# Patient Record
Sex: Male | Born: 1970 | Race: White | Hispanic: No | Marital: Married | Smoking: Never smoker
Health system: Southern US, Community
[De-identification: ages and names within clinical notes are randomized; demographics above are authoritative.]

## PROBLEM LIST (undated history)

## (undated) DIAGNOSIS — J302 Other seasonal allergic rhinitis: Secondary | ICD-10-CM

## (undated) DIAGNOSIS — M1990 Unspecified osteoarthritis, unspecified site: Secondary | ICD-10-CM

---
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
## 2001-09-24 ENCOUNTER — Emergency Department (HOSPITAL_COMMUNITY): Admission: EM | Admit: 2001-09-24 | Discharge: 2001-09-25 | Payer: Self-pay | Admitting: Emergency Medicine

## 2001-09-25 ENCOUNTER — Encounter: Payer: Self-pay | Admitting: Emergency Medicine

## 2002-04-11 ENCOUNTER — Emergency Department (HOSPITAL_COMMUNITY): Admission: EM | Admit: 2002-04-11 | Discharge: 2002-04-11 | Payer: Self-pay | Admitting: *Deleted

## 2002-04-25 ENCOUNTER — Encounter: Admission: RE | Admit: 2002-04-25 | Discharge: 2002-04-25 | Payer: Self-pay | Admitting: Internal Medicine

## 2002-04-25 ENCOUNTER — Encounter: Payer: Self-pay | Admitting: Internal Medicine

## 2004-06-29 HISTORY — PX: SP ARTHRO ELBOW*R*: HXRAD196

## 2005-07-22 ENCOUNTER — Inpatient Hospital Stay (HOSPITAL_COMMUNITY): Admission: EM | Admit: 2005-07-22 | Discharge: 2005-07-23 | Payer: Self-pay | Admitting: Emergency Medicine

## 2005-07-23 ENCOUNTER — Encounter (INDEPENDENT_AMBULATORY_CARE_PROVIDER_SITE_OTHER): Payer: Self-pay | Admitting: Cardiology

## 2007-03-22 ENCOUNTER — Encounter: Admission: RE | Admit: 2007-03-22 | Discharge: 2007-03-22 | Payer: Self-pay | Admitting: Orthopedic Surgery

## 2009-06-13 ENCOUNTER — Ambulatory Visit (HOSPITAL_BASED_OUTPATIENT_CLINIC_OR_DEPARTMENT_OTHER): Admission: RE | Admit: 2009-06-13 | Discharge: 2009-06-13 | Payer: Self-pay | Admitting: General Surgery

## 2010-07-05 ENCOUNTER — Ambulatory Visit (HOSPITAL_COMMUNITY)
Admission: RE | Admit: 2010-07-05 | Discharge: 2010-07-05 | Payer: Self-pay | Source: Home / Self Care | Attending: Family Medicine | Admitting: Family Medicine

## 2010-09-29 LAB — POCT I-STAT 4, (NA,K, GLUC, HGB,HCT): Glucose, Bld: 92 mg/dL (ref 70–99)

## 2010-11-14 NOTE — Consult Note (Signed)
[name], [name]                 ACCOUNT NO.:  [id number]   MEDICAL RECORD NO.:  [id number]          PATIENT TYPE:  INP   LOCATION:  3041                         FACILITY:  [facility]   PHYSICIAN:  Michael L. Reynolds, M.D.DATE OF BIRTH:  01-02-1971   DATE OF CONSULTATION:  07/23/2005  DATE OF DISCHARGE:                                   CONSULTATION   REQUESTING PHYSICIAN:  Dr. Kirby Funk.   REASON FOR EVALUATION:  Possible TIA.   HISTORY OF PRESENT ILLNESS:  This is the initial outpatient consult visit  for this 40 year old man with little significant past medical history.  The  patient was admitted after an event yesterday.  He states that the event  began with some visual changes.  He noted that he was driving down the road  and he noticed that he could not see very well off to his right.  In  particular, he was driving with a friend sitting in the passenger's seat and  he noticed sometimes that he could not see his friend sitting over the  passenger's seat.  This did seem to come and go somewhat, sometimes being  dark and sometimes being better.  After about 10 minutes or so, this  resolved.  He then arrived at a work site and saw a Surveyor, [employer] with whom he  works very commonly, but states that he could not remember the contractor's  name.  His friend noticed that he seemed a little bit disturbed when he was  trying to talk.  The contractor insisted that the patient see his doctor  immediately and the patient could not recall his doctor's name; however, he  could recall how to get to the office and went to see his primary physician,  Dr. Valentina Lucks.  By this time the patient had developed a headache which he  described as a fairly severe steady pain like a sinus pressure in between  the eyes and deep in the head, which really was not of a throbbing character  and not associated particularly with photophobia or phonophobia.  He says  that he has had headaches like this in the past,  but this was a particularly  severe one and the headache lasted for a few hours and then resolved, and  since then he has felt fine.  He denies ever having any symptoms like this  before.  He does admit on specific questioning that sometimes he will notice  transient blurring of his vision for a couple of minutes, which will come  and go during the day, especially if he is reading; he is not sure if this  monocular or binocular.  He does not really have a significant headache  history.  He denies ever having any significant head injury or trauma and  has never had a seizure to his knowledge.   PAST MEDICAL HISTORY:  1.  As above.  2.  Seasonal allergic rhinitis.  3.  Hemorrhoids.  4.  Prostatitis.  5.  History of tonsillectomy.  6.  Left knee arthroscopy.   FAMILY HISTORY:  His father had some  early coronary disease.  He also has a  history of Crohn's disease and migraine.  His mother also has migraine as  well as hypertension.   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  He is married and has 3 children.  He does not smoke,  occasionally consumes alcohol, denies using illicit drugs.   REVIEW OF SYSTEMS:  Per HPI and per the admission nursing record.   MEDICATIONS:  Prior to admission, he was on no medication.  He has only been  receiving aspirin.   PHYSICAL EXAMINATION:  VITAL SIGNS:  Temperature 97.2 degrees, blood  pressure 128/76, pulse 76, respirations 18, O2 SAT 98% on room air.  GENERAL EXAMINATION:  This is a healthy-appearing man seen in no evident  distress.  HEENT:  Cranium is normocephalic and atraumatic.  Oropharynx benign.  NECK:  Supple without carotid or supraclavicular bruits.  HEART:  Regular rate and rhythm without murmurs.  CHEST:  Clear to auscultation bilaterally.  EXTREMITIES:  No edema.  Pulses 2+.  NEUROLOGIC:  Mental status:  He is awake, alert and fully oriented to time,  place and person.  Recent and  remote memory are intact.  Attention span,  concentration and fund of knowledge are all appropriate.  Speech is fluent  and not dysarthric.  There are no defects to confrontational naming.  Mood  is euthymic and affect appropriate.  Cranial nerves:  Pupils are equal and  reactive.  Extraocular movements are full without nystagmus.  Visual fields  are full to confrontation.  Hearing is intact to conversational speech.  Facial sensation is intact to light touch.  Face, tongue and palate move  normally and symmetrically.  Shoulder shrug strength is normal.  Motor  testing:  Normal bulk and tone.  Normal strength in all tested extremity  muscles.  Sensation intact to light touch in all extremities.  Coordination:  Rapid movements are performed well.  Finger-to-nose and heel-to-shin are  performed well.  Gait:  He arises from a chair easily and his stance is  normal.  He is able to toe and tandem walk without difficulty.  Reflexes are  2+ and symmetric.  Toes are downgoing bilaterally.   LABORATORY REVIEW:  MRI of the brain performed without contrast last night  is personally reviewed with MRA and intracranial circulation, and I would  agree that both of those studies are normal.   He has also had some baseline labs including a CBC, CMET and coagulations  which are unremarkable.   He had a carotid Doppler study done today which demonstrated a 40% to 60%  right ICA stenosis at the low end of the scale.   Two-dimensional echocardiogram is pending at this time.   IMPRESSION:  1.  Transient right-sided visual and speech disturbance; etiology of this is      unclear.  It is possible that he had a transient ischemic attack, but it      is hard to put this together and he does not have any significant risk      factors.  Less likely, this is a possible seizure event.  I am also      suspicious that he may have had a migrainous event. 2.  Mild right internal carotid artery stenosis by  Doppler.   RECOMMENDATIONS:  He will take an aspirin daily and reassess his risk  factors including blood pressure, blood sugar, cholesterol, etc.  We will  also  check an EEG before he leaves just to make sure there is no evidence of  seizure.  I think he is stable for discharge once his tests are done and he  can follow up with his primary care doctor and with [place] in a few weeks.  If  symptoms recur, we may consider a migraine prophylactic.   Thank you for the consultation.      Michael L. Thad Ranger, M.D.  Electronically Signed    MLR/MEDQ  D:  07/23/2005  T:  07/24/2005  Job:  [id number]

## 2010-11-14 NOTE — Procedures (Signed)
EEG NUMBER:  [id number].   REQUESTING PHYSICIAN:  Marolyn Hammock. Thad Ranger, M.D.   CLINICAL HISTORY:  This is a 40 year old man being evaluated for transient  speech and visual disturbance, possible seizure.  Patient is described as  awake.  This is a routine EEG done with photic stimulation and  hyperventilation.   DESCRIPTION:  The dominant rhythm of this tracing is a moderate amplitude of  alpha rhythm of 10 Hz which predominates posteriorly.  Appears to not have  normal asymmetry and attenuates with eye opening and closing.  Low amplitude  fast activity is seen frontally and centrally and appears without abnormal  asymmetry.  No focal slowing is noted. No epileptiform discharges are seen.  Brief bouts of drowsiness are seen as evidenced by occasional fragmentation  in the background and occasional appearance of a generalized slightly higher  amplitude [place] rhythm of 7 to 8 Hz without focal abnormalities or  epileptiform discharges.  No sleep is recorded during the study.  Photic  stimulation produced symmetric driving responses.  Hyperventilation produced  mild slowing and build-up in the background rhythms.  Single channel devoted  to EKG revealed sinus rhythm throughout with a rate of approximately 72  beats per minute.   CONCLUSION:  Normal study in the awake state.      Michael L. Thad Ranger, M.D.  Electronically Signed     ZOX:WRUE  D:  07/23/2005 17:30:38  T:  07/24/2005 09:03:35  Job #:  [id number]   cc:   Casimiro Needle L. Thad Ranger, M.D.  Fax: [phone]

## 2010-11-14 NOTE — Discharge Summary (Signed)
NAMERAYEN, [name] NO.:  Buckley   MEDICAL RECORD NO.:  [id number]          PATIENT TYPE:  INP   LOCATION:  3041                         [facility]AC[name]   PHYSICIAN:  [name], M.D.  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  He could not see the friend out of his peripheral vision.  He later noticed  that when talking, he could not seem to find appropriate words.  He knew  what he wanted to say, but the words would not come out and make sense.  This was followed by onset of moderate-to-severe headache which was behind  both eyes.  This eventually resolved later in the evening.  There was no  nausea, vomiting, photophobia or phonophobia.  When he drove to his job, he  met a Surveyor, [employer] who he knows very well, but could not remember his name.  He could not remember my name.  The contractor insisted he come to my office  to be checked.   SIGNIFICANT FINDINGS:  VITAL SIGNS:  Blood pressure 128/90, heart rate 84,  temperature 98.4.  Weight is 206.  LUNGS:  Clear.  HEART:  Regular rate and rhythm without murmur, gallop or rub.  ABDOMEN:  Soft and nontender.  No mass or hepatosplenomegaly.  EXTREMITIES:  No edema.  NEUROLOGICAL:  Alert and oriented x3.  Cranial nerves II-XII are intact.  Strength is 5/5 in all extremities.  Reflexes are 2/4 throughout and toes  downgoing.  Gait normal.  Heel-to-toe well-done.  Visual fields were intact.   ADMISSION LABORATORY DATA:  WBC 10.2, hemoglobin 16.8, platelet count  233,000.  PT 12.6, PTT 28.  Sodium 138, potassium 4.2, chloride 106,  bicarbonate 26, glucose 81, BUN 20, creatinine 1.1, calcium 9.1, total  protein 7.2, albumin 4.1, SGOT 33, SGPT 45, alkaline  phosphatase 53,  bilirubin 0.8.   RADIOLOGIC FINDINGS:  Chest x-ray:  No active disease.   ACCESSORY CLINICAL DATA:  Normal sinus rhythm, normal EKG.   HOSPITAL COURSE:  The patient was admitted with a differential of TIA,  migraine headache or seizure.  He had a TIA workup which included:  #1 -  MRI/MRA of the intracranial vessels which were normal.  #2 - A carotid  ultrasound which showed a 40% to 60% ICA stenosis on the right and no  hemodynamically significant stenosis on the left.  Vertebral flow was  antegrade.  #3 - Two-dimensional echocardiogram which was normal.  #4 - An  EEG which is pending at the time of this dictation.  The patient was placed  on 1 aspirin a day.  He was seen by the Neurology Service, Dr. Jacki Buckley.  Dr. Thad Buckley' impression was transient speech and right-sided  visual disturbance, unclear etiology, ? migraine effect versus TIA or  seizure.  The patient did have a lipid profile, TSH, homocysteine,  hemoglobin A1c which was pending at the time of this dictation.  His fasting  glucose and ESR were normal.   DISCHARGE DIAGNOSES:  1.  Transient visual disturbance.  2.  Aphasia.  3.  Confusion.   PROCEDURES:  1.  MRI/MRA of the brain.  2.  Two-dimensional echocardiogram.  3.  Carotid ultrasound.  4.  EEG.   DISCHARGE MEDICATIONS:  Aspirin 325 mg a day.   DIET:  Regular.   ACTIVITY:  Activity as tolerated.   FOLLOWUP:  Follow up in 1-2 weeks with Dr. Valentina Buckley, also following up with  Dr. Kelli Buckley.           ______________________________  [name], M.D.     JJG/MEDQ  D:  07/24/2005  T:  07/24/2005  Job:  [id number]   cc:   Casimiro Needle L. [name], M.D.  Fax: [phone]

## 2011-12-19 ENCOUNTER — Encounter (HOSPITAL_COMMUNITY): Payer: Self-pay | Admitting: Emergency Medicine

## 2011-12-19 DIAGNOSIS — IMO0002 Reserved for concepts with insufficient information to code with codable children: Secondary | ICD-10-CM | POA: Insufficient documentation

## 2011-12-19 NOTE — ED Notes (Signed)
PY. REPORTS ABSCESS AT LEFT ELBOW WITH NO DRAINAGE ONSET THIS EVENING .

## 2011-12-20 ENCOUNTER — Emergency Department (HOSPITAL_COMMUNITY)
Admission: EM | Admit: 2011-12-20 | Discharge: 2011-12-20 | Disposition: A | Discharge status: Home / Self Care | Payer: 59 | Attending: Emergency Medicine | Admitting: Emergency Medicine

## 2011-12-20 DIAGNOSIS — L0390 Cellulitis, unspecified: Secondary | ICD-10-CM

## 2011-12-20 MED ORDER — CLINDAMYCIN PHOSPHATE 900 MG/50ML IV SOLN
900.0000 mg | Freq: Once | INTRAVENOUS | Status: AC
  Administered 2011-12-20: 900 mg via INTRAVENOUS
  Filled 2011-12-20: qty 50

## 2011-12-20 MED ORDER — CLINDAMYCIN HCL 300 MG PO CAPS: 300.0000 mg | ORAL_CAPSULE | Freq: Four times a day (QID) | ORAL | Status: AC

## 2011-12-20 NOTE — Discharge Instructions (Signed)
You were seen and evaluated for swelling and pain to her left elbow area. At this time your providers were concerned for possible skin infection. Given given prescriptions for antibiotics to use for your infection. Please followup in the next 24-48 hours with your primary care provider or return to emergency room for recheck of your symptoms and infection. If your symptoms worsen, you develop fever, chills, sweats please return sooner.    Cellulitis Cellulitis is an infection of the skin and the tissue beneath it. The area is typically red and tender. It is caused by germs (bacteria) (usually staph or strep) that enter the body through cuts or sores. Cellulitis most commonly occurs in the arms or lower legs.  HOME CARE INSTRUCTIONS   If you are given a prescription for medications which kill germs (antibiotics), take as directed until finished.   If the infection is on the arm or leg, keep the limb elevated as able.   Use a warm cloth several times per day to relieve pain and encourage healing.   See your caregiver for recheck of the infected site as directed if problems arise.   Only take over-the-counter or prescription medicines for pain, discomfort, or fever as directed by your caregiver.  SEEK MEDICAL CARE IF:   The area of redness (inflammation) is spreading, there are red streaks coming from the infected site, or if a part of the infection begins to turn dark in color.   The joint or bone underneath the infected skin becomes painful after the skin has healed.   The infection returns in the same or another area after it seems to have gone away.   A boil or bump swells up. This may be an abscess.   New, unexplained problems such as pain or fever develop.  SEEK IMMEDIATE MEDICAL CARE IF:   You have a fever.   You or your child feels drowsy or lethargic.   There is vomiting, diarrhea, or lasting discomfort or feeling ill (malaise) with muscle aches and pains.  MAKE SURE YOU:    Understand these instructions.   Will watch your condition.   Will get help right away if you are not doing well or get worse.  Document Released: 03/25/2005 Document Revised: 06/04/2011 Document Reviewed: 02/01/2008 [facility] Patient Information 2012 [place], [place].

## 2011-12-20 NOTE — ED Provider Notes (Signed)
Medical screening examination/treatment/procedure(s) were performed by non-physician practitioner and as supervising physician I was immediately available for consultation/collaboration.  Gerhard Munch, MD 12/20/11 [phone]

## 2011-12-20 NOTE — ED Provider Notes (Signed)
History     CSN: [id number]  Arrival date & time 12/19/11  2229   First MD Initiated Contact with Patient 12/20/11 0037      Chief Complaint  Patient presents with  . Abscess   HPI  History provided by the patient. Patient is a 41 year old male with no significant past medical history presents with complaints of pain, redness and swelling to his left elbow and increased this evening. 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 Today patient began to have redness and swelling he states he was unsure if she was bitten by something of concern of having infection over the elbow area. Patient reports having swelling and injury to his right elbow 4-5 years ago that had a severe infection that required surgery by orthopedic specialist and drainage. Patient states he was seen by Dr. Madelon Lips at that time. Today patient is worried that something similar may be happening to the left elbow and skin area. Patient has some pain with full extension full flexion of the elbow otherwise has full range of motion. He denies any numbness or weakness to the hand. He denies any erythematous streaks, fever, chills or sweats.    History reviewed. No pertinent past medical history.  History reviewed. No pertinent past surgical history.  No family history on file.  History  Substance Use Topics  . Smoking status: Never Smoker   . Smokeless tobacco: Not on file  . Alcohol Use: Yes      Review of Systems  Constitutional: Negative for fever, chills and diaphoresis.  Gastrointestinal: Negative for nausea and vomiting.  Musculoskeletal: Positive for joint swelling.  Skin: Positive for rash.  Neurological: Negative for weakness and numbness.    Allergies  Review of patient's allergies indicates no known allergies.  Home Medications  No current outpatient prescriptions on file.  BP 148/72  Pulse 87  Temp 97.9 F (36.6 C) (Oral)  Resp 14  SpO2  98%  Physical Exam  Nursing note and vitals reviewed. Constitutional: He is oriented to person, place, and time. He appears well-developed and well-nourished.  HENT:  Head: Normocephalic.  Cardiovascular: Normal rate and regular rhythm.   Pulmonary/Chest: Effort normal and breath sounds normal.  Musculoskeletal:       Erythema and mild swelling over the posterior left elbow with mild tenderness to palpation. No significant induration or fluctuance noted. There is a single small pustule possible ingrown hair to lateral aspect within the swelling. Normal distal radial pulses, grip strength and sensation in fingers.  Neurological: He is alert and oriented to person, place, and time.  Psychiatric: He has a normal mood and affect.    ED Course  Procedures       1. Cellulitis       MDM  Patient seen and evaluated. Patient no acute distress.  Skin is erythematous and warm to the touch. Symptoms are concerning for possible cellulitis. Skin marker used to outline erythematous area. Patient given IV dose of clindamycin. Patient also prescription for clindamycin and continue outpatient. Patient instructed for recheck of symptoms in the next 48 hours.     Angus Seller, [place] 12/20/11 [phone]

## 2012-09-28 ENCOUNTER — Ambulatory Visit: Payer: 59

## 2012-09-28 ENCOUNTER — Ambulatory Visit (INDEPENDENT_AMBULATORY_CARE_PROVIDER_SITE_OTHER): Payer: 59 | Admitting: Family Medicine

## 2012-09-28 VITALS — BP 132/82 | HR 79 | Temp 98.1°F | Resp 16 | Ht 69.5 in | Wt 201.0 lb

## 2012-09-28 DIAGNOSIS — R51 Headache: Secondary | ICD-10-CM

## 2012-09-28 DIAGNOSIS — J329 Chronic sinusitis, unspecified: Secondary | ICD-10-CM

## 2012-09-28 LAB — POCT CBC
Lymph, poc: 2.3 (ref 0.6–3.4)
MCH, POC: 29.9 pg (ref 27–31.2)
MCHC: 32.8 g/dL (ref 31.8–35.4)
MCV: 91 fL (ref 80–97)
MID (cbc): 0.4 (ref 0–0.9)
MPV: 9.5 fL (ref 0–99.8)
POC LYMPH PERCENT: 35.1 %L (ref 10–50)
POC MID %: 6.9 %M (ref 0–12)
Platelet Count, POC: 233 10*3/uL (ref 142–424)
RBC: 5.15 M/uL (ref 4.69–6.13)
RDW, POC: 13.5 %
WBC: 6.5 10*3/uL (ref 4.6–10.2)

## 2012-09-28 MED ORDER — AMOXICILLIN 875 MG PO TABS: 875.0000 mg | ORAL_TABLET | Freq: Two times a day (BID) | ORAL | Status: DC

## 2012-09-28 MED ORDER — FLUTICASONE PROPIONATE 50 MCG/ACT NA SUSP: 2.0000 | Freq: Every day | NASAL | Status: DC

## 2012-09-28 MED ORDER — PREDNISONE 20 MG PO TABS: ORAL_TABLET | ORAL | Status: DC

## 2012-09-28 NOTE — Patient Instructions (Addendum)
Use the nose spray 2 sprays each nostril twice daily for 3 days, then drop back to once daily  Take the prednisone 3 daily for 2 days, then daily for 2 days, then one daily for 2 days as directed  Take the amoxicillin one twice daily for 3 weeks  If you're not improving we will need to get the ear nose and throat doctor for recheck in

## 2012-09-28 NOTE — Progress Notes (Signed)
Subjective: 42 year old man with history of chronic sinusitis off and on. 2 years ago he had to go to an ENT, and CT scan revealed chronic sinusitis with a right maxillary sinus. He has been having problems at: January February and has been feeling lousy since then. He just doesn't have much energy. He has recurrent facial headaches, not on a daily basis. He has nasal congestion. He does have a history of deviated septum also. He does not smoke. He works out to [employer] out at [employer].   Objective: TMs normal. Throat clear. Minimal sinus tenderness. Eyes PERRLA. Chest clear. Heart regular.  Assessment: History chronic sinusitis with headaches and facial pain for past couple of months  Plan: Sinus series, CBC, sedimentation rate  [facility] reading (PRIMARY) by  Dr. Alwyn Ren Right maxillary haziness and ? Cyst.  This was seen on a previous CT 2 years ago apparently.  Results for orders placed in visit on 09/28/12  POCT CBC      Result Value Range   WBC 6.5  4.6 - 10.2 K/uL   Lymph, poc 2.3  0.6 - 3.4   POC LYMPH PERCENT 35.1  10 - 50 %L   MID (cbc) 0.4  0 - 0.9   POC MID % 6.9  0 - 12 %M   POC Granulocyte 3.8  2 - 6.9   Granulocyte percent 58.0  37 - 80 %G   RBC 5.15  4.69 - 6.13 M/uL   Hemoglobin 15.4  14.1 - 18.1 g/dL   HCT, POC 16.1  09.6 - 53.7 %   MCV 91.0  80 - 97 fL   MCH, POC 29.9  27 - 31.2 pg   MCHC 32.8  31.8 - 35.4 g/dL   RDW, POC 04.5     Platelet Count, POC 233  142 - 424 K/uL   MPV 9.5  0 - 99.8 fL   Will RX for chronic sinusitis .

## 2012-10-26 ENCOUNTER — Emergency Department (HOSPITAL_COMMUNITY)
Admission: EM | Admit: 2012-10-26 | Discharge: 2012-10-26 | Disposition: A | Discharge status: Home / Self Care | Payer: 59 | Source: Home / Self Care | Attending: Emergency Medicine | Admitting: Emergency Medicine

## 2012-10-26 ENCOUNTER — Encounter (HOSPITAL_COMMUNITY): Payer: Self-pay | Admitting: *Deleted

## 2012-10-26 DIAGNOSIS — L270 Generalized skin eruption due to drugs and medicaments taken internally: Secondary | ICD-10-CM

## 2012-10-26 HISTORY — DX: Other seasonal allergic rhinitis: J30.2

## 2012-10-26 NOTE — ED Provider Notes (Signed)
Medical screening examination/treatment/ procedure(s) were performed by non-physician practitioner and as supervising physician I was immediately available for consultations/colaborattion.   [employer] [place],M.D.  Duwayne Heck [place], MD 10/26/12 2109

## 2012-10-26 NOTE — ED Notes (Signed)
C/o rash on his arms, chest and shoulders onset 1 week ago.  Face neck and arms are red like sunburn.  States he was on Amoxil 875 mg. For sinus infection but stopped it yesterday.  He stopped the Prednisone and Fluticasone 1 week ago.  C/o painful lymph nodes in his neck, ears stopped up and hurting and pressure around his eyes. C/o low grade fever.  Has not checked it.  C/o feeling like his throat is closing up onset last week.  States it comes and goes- mostly at night.  C/o aching all over.

## 2012-10-26 NOTE — ED Provider Notes (Signed)
History     CSN: [id number]  Arrival date & time 10/26/12  1437   First MD Initiated Contact with Patient 10/26/12 1650      Chief Complaint  Patient presents with  . Rash    (Consider location/radiation/quality/duration/timing/severity/associated sxs/prior treatment) HPI Comments: Pt reports taking amoxicillin prescribed on by outside urgent care for sinus infection until yesterday. Thinks is causing rash on body and "redness" of face x 1 week.   Patient is a 42 y.o. male presenting with rash. The history is provided by the patient.  Rash Location:  Torso and shoulder/arm Shoulder/arm rash location:  L upper arm and R upper arm Torso rash location:  Upper back, L chest and R chest Quality: redness   Quality: not itchy and not painful   Severity:  Moderate Onset quality:  Sudden Duration:  1 week Timing:  Constant Progression:  Spreading Chronicity:  New Context: medications   Relieved by:  Nothing Worsened by:  Nothing tried Ineffective treatments:  None tried Associated symptoms: no fever and no myalgias     Past Medical History  Diagnosis Date  . Seasonal allergies     Past Surgical History  Procedure Laterality Date  . Sp arthro elbow*r* Right 2006  . Knee arthroscopy Left 1989    Family History  Problem Relation Age of Onset  . Diabetes Mother   . Cancer Father     Prostate    History  Substance Use Topics  . Smoking status: Never Smoker   . Smokeless tobacco: Not on file  . Alcohol Use: Yes     Comment: occasional      Review of Systems  Constitutional: Negative for fever and chills.  Musculoskeletal: Negative for myalgias.  Skin: Positive for rash.    Allergies  Review of patient's allergies indicates no known allergies.  Home Medications   Current Outpatient Rx  Name  Route  Sig  Dispense  Refill  . amoxicillin (AMOXIL) 875 MG tablet   Oral   Take 1 tablet (875 mg total) by mouth 2 (two) times daily.   40 tablet   0   .  fluticasone (FLONASE) 50 MCG/ACT nasal spray   Nasal   Place 2 sprays into the nose daily.   16 g   1   . predniSONE (DELTASONE) 20 MG tablet      Take 3 daily for 2 days, then 2 daily for 2 days, then 1 daily for 2 days   12 tablet   0     BP 147/100  Pulse 69  Temp(Src) 98.3 F (36.8 C) (Oral)  Resp 16  SpO2 98%  Physical Exam  Constitutional: He appears well-developed and well-nourished. No distress.  HENT:  Nose: Right sinus exhibits maxillary sinus tenderness and frontal sinus tenderness. Left sinus exhibits maxillary sinus tenderness and frontal sinus tenderness.  Mouth/Throat: Oropharynx is clear and moist and mucous membranes are normal.  Lymphadenopathy:       Head (right side): No submental, no submandibular and no tonsillar adenopathy present.       Head (left side): No submental, no submandibular and no tonsillar adenopathy present.  Skin: Skin is warm and dry. Rash noted. Rash is macular.  Macular/patchy pink rash on upper arms, chest and upper back, blanchable.      ED Course  Procedures (including critical care time)  Labs Reviewed - No data to display No results found.   1. Drug eruption       MDM  Rash  most likely from drug reaction. Mild reaction, no treatment needed. Pt has already stopped amoxicillin, will add to his drug allergies.  Pt c/o sinus congestion at end of visit. Recommended he talk with his pcp about referral to ENT, and recommended he restart using flonase spray (which he had stopped) and try nasal saline spray.         Cathlyn Parsons, NP 10/26/12 [phone]

## 2014-07-03 ENCOUNTER — Ambulatory Visit
Admission: RE | Admit: 2014-07-03 | Discharge: 2014-07-03 | Disposition: A | Discharge status: Home / Self Care | Payer: BLUE CROSS/BLUE SHIELD | Source: Ambulatory Visit | Attending: Internal Medicine | Admitting: Internal Medicine

## 2014-07-03 ENCOUNTER — Other Ambulatory Visit: Payer: Self-pay | Admitting: Internal Medicine

## 2014-07-03 DIAGNOSIS — M542 Cervicalgia: Secondary | ICD-10-CM

## 2016-05-29 ENCOUNTER — Other Ambulatory Visit: Payer: Self-pay | Admitting: Gastroenterology

## 2016-06-26 ENCOUNTER — Encounter (HOSPITAL_COMMUNITY): Payer: Self-pay

## 2016-07-02 ENCOUNTER — Encounter (HOSPITAL_COMMUNITY): Payer: Self-pay

## 2016-07-06 ENCOUNTER — Ambulatory Visit (HOSPITAL_COMMUNITY): Payer: 59 | Admitting: Certified Registered Nurse Anesthetist

## 2016-07-06 ENCOUNTER — Ambulatory Visit (HOSPITAL_COMMUNITY)
Admission: RE | Admit: 2016-07-06 | Discharge: 2016-07-06 | Disposition: A | Discharge status: Home / Self Care | Payer: 59 | Source: Ambulatory Visit | Attending: Gastroenterology | Admitting: Gastroenterology

## 2016-07-06 ENCOUNTER — Encounter (HOSPITAL_COMMUNITY)
Admission: RE | Disposition: A | Discharge status: Home / Self Care | Payer: Self-pay | Source: Ambulatory Visit | Attending: Gastroenterology

## 2016-07-06 ENCOUNTER — Encounter (HOSPITAL_COMMUNITY): Payer: Self-pay

## 2016-07-06 DIAGNOSIS — Z86010 Personal history of colonic polyps: Secondary | ICD-10-CM | POA: Insufficient documentation

## 2016-07-06 DIAGNOSIS — Z1211 Encounter for screening for malignant neoplasm of colon: Secondary | ICD-10-CM | POA: Insufficient documentation

## 2016-07-06 HISTORY — DX: Unspecified osteoarthritis, unspecified site: M19.90

## 2016-07-06 HISTORY — PX: COLONOSCOPY WITH PROPOFOL: SHX5780

## 2016-07-06 SURGERY — COLONOSCOPY WITH PROPOFOL
Anesthesia: Monitor Anesthesia Care

## 2016-07-06 MED ORDER — PROPOFOL 10 MG/ML IV BOLUS
INTRAVENOUS | Status: AC
  Filled 2016-07-06: qty 40

## 2016-07-06 MED ORDER — PROPOFOL 10 MG/ML IV BOLUS
INTRAVENOUS | Status: DC | PRN
  Administered 2016-07-06: 20 mg via INTRAVENOUS

## 2016-07-06 MED ORDER — PROPOFOL 500 MG/50ML IV EMUL
INTRAVENOUS | Status: DC | PRN
  Administered 2016-07-06: 200 ug/kg/min via INTRAVENOUS

## 2016-07-06 MED ORDER — ONDANSETRON HCL 4 MG/2ML IJ SOLN
INTRAMUSCULAR | Status: AC
  Filled 2016-07-06: qty 2

## 2016-07-06 MED ORDER — LACTATED RINGERS IV SOLN
INTRAVENOUS | Status: DC
  Administered 2016-07-06: 08:00:00 via INTRAVENOUS

## 2016-07-06 MED ORDER — LIDOCAINE 2% (20 MG/ML) 5 ML SYRINGE
INTRAMUSCULAR | Status: DC | PRN
  Administered 2016-07-06: 100 mg via INTRAVENOUS

## 2016-07-06 MED ORDER — LIDOCAINE 2% (20 MG/ML) 5 ML SYRINGE
INTRAMUSCULAR | Status: AC
  Filled 2016-07-06: qty 5

## 2016-07-06 MED ORDER — SODIUM CHLORIDE 0.9 % IV SOLN: INTRAVENOUS | Status: DC

## 2016-07-06 MED ORDER — ONDANSETRON HCL 4 MG/2ML IJ SOLN
INTRAMUSCULAR | Status: DC | PRN
  Administered 2016-07-06: 4 mg via INTRAVENOUS

## 2016-07-06 SURGICAL SUPPLY — 22 items

## 2016-07-06 NOTE — Op Note (Signed)
[facility] Patient Name: [name] Procedure Date: 07/06/2016 MRN: [id number] Attending MD: Charolett Bumpers , MD Date of Birth: 11/27/1970 CSN: [id number] Age: 46 Admit Type: Outpatient Procedure:                Colonoscopy Indications:              High risk colon cancer surveillance: Personal                            history of non-advanced adenoma: 03/24/2011                            colonoscopy was performed with removal of a 3 mm                            tubular adenomatous descending colon polyp Providers:                Charolett Bumpers, MD, Roselie Awkward, RN, Lorenda Ishihara, Technician, Maricela Curet, CRNA Referring MD:              Medicines:                Propofol per Anesthesia Complications:            No immediate complications. Estimated Blood Loss:     Estimated blood loss: none. Procedure:                Pre-Anesthesia Assessment:                           - Prior to the procedure, a History and Physical                            was performed, and patient medications and                            allergies were reviewed. The patient's tolerance of                            previous anesthesia was also reviewed. The risks                            and benefits of the procedure and the sedation                            options and risks were discussed with the patient.                            All questions were answered, and informed consent                            was obtained. Prior Anticoagulants: The patient has  taken no previous anticoagulant or antiplatelet                            agents. ASA Grade Assessment: II - A patient with                            mild systemic disease. After reviewing the risks                            and benefits, the patient was deemed in                            satisfactory condition to undergo the procedure.                           After  obtaining informed consent, the colonoscope                            was passed under direct vision. Throughout the                            procedure, the patient's blood pressure, pulse, and                            oxygen saturations were monitored continuously. The                            Colonoscope was introduced through the anus and                            advanced to the the cecum, identified by                            appendiceal orifice and ileocecal valve. The                            colonoscopy was performed without difficulty. The                            patient tolerated the procedure well. The quality                            of the bowel preparation was good. The terminal                            ileum, the ileocecal valve, the appendiceal orifice                            and the rectum were photographed. Scope In: 8:47:38 AM Scope Out: 8:59:03 AM Scope Withdrawal Time: 0 hours 8 minutes 8 seconds  Total Procedure Duration: 0 hours 11 minutes 25 seconds  Findings:      The perianal and digital rectal examinations were normal.      The entire examined colon appeared normal. Impression:               -  The entire examined colon is normal.                           - No specimens collected. Moderate Sedation:      N/A- Per Anesthesia Care Recommendation:           - Patient has a contact number available for                            emergencies. The signs and symptoms of potential                            delayed complications were discussed with the                            patient. Return to normal activities tomorrow.                            Written discharge instructions were provided to the                            patient.                           - Repeat colonoscopy in 10 years for surveillance.                           - Resume previous diet.                           - Continue present medications. Procedure Code(s):         --- Professional ---                           [id number], Colorectal cancer screening; colonoscopy on                            individual at high risk Diagnosis Code(s):        --- Professional ---                           Z86.010, Personal history of colonic polyps CPT copyright 2016 [employer]. All rights reserved. The codes documented in this report are preliminary and upon coder review may  be revised to meet current compliance requirements. Danise EdgeMartin Dmiya Malphrus, MD Charolett BumpersMartin K Shaquia Berkley, MD 07/06/2016 9:05:45 AM This report has been signed electronically. Number of Addenda: 0

## 2016-07-06 NOTE — Discharge Instructions (Signed)

## 2016-07-06 NOTE — Transfer of Care (Signed)
Immediate Anesthesia Transfer of Care Note  Patient: [name]  Procedure(s) Performed: Procedure(s): COLONOSCOPY WITH PROPOFOL (N/A)  Patient Location: PACU  Anesthesia Type:MAC  Level of Consciousness:  sedated, patient cooperative and responds to stimulation  Airway & Oxygen Therapy:Patient Spontanous Breathing and Patient connected to face mask oxgen  Post-op Assessment:  Report given to PACU RN and Post -op Vital signs reviewed and stable  Post vital signs:  Reviewed and stable  Last Vitals:  Vitals:   07/06/16 0806 07/06/16 0816  BP: (!) 156/100 (!) 142/94  Pulse: 90   Resp: 16   Temp: 76.1 C     Complications: No apparent anesthesia complications

## 2016-07-06 NOTE — H&P (Signed)
Procedure: Surveillance colonoscopy. 03/24/2011 colonoscopy was performed with removal of a 3 mm ascending colon tubular adenomatous polyp  History: The patient is a 46 year old male born 10/29/1970. He is scheduled to undergo a surveillance colonoscopy today.  Past medical history: Seasonal allergic rhinitis. Anal fissure. Tonsillectomy. Left knee arthroscopy. Left elbow surgery. Anal fissure surgery. Depression.  Exam: The patient is alert and lying comfortably on the endoscopy stretcher. Abdomen is soft and nontender to palpation. Lungs are clear to auscultation. Cardiac exam reveals a regular rhythm.  Plan: Proceed with surveillance colonoscopy

## 2016-07-06 NOTE — Anesthesia Postprocedure Evaluation (Addendum)
Anesthesia Post Note  Patient: [name]  Procedure(s) Performed: Procedure(s) (LRB): COLONOSCOPY WITH PROPOFOL (N/A)  Patient location during evaluation: PACU Anesthesia Type: MAC Level of consciousness: awake and alert Pain management: pain level controlled Vital Signs Assessment: post-procedure vital signs reviewed and stable Respiratory status: spontaneous breathing Cardiovascular status: stable Anesthetic complications: no       Last Vitals:  Vitals:   07/06/16 0920 07/06/16 0925  BP: (!) 140/92   Pulse: 73 67  Resp: 13 16  Temp:      Last Pain:  Vitals:   07/06/16 0918  TempSrc: Oral                 Nolon Nations

## 2016-07-06 NOTE — Anesthesia Preprocedure Evaluation (Signed)
Anesthesia Evaluation  Patient identified by MRN, date of birth, ID band Patient awake    Reviewed: Allergy & Precautions, NPO status , Patient's Chart, lab work & pertinent test results  Airway Mallampati: II  TM Distance: >3 FB Neck ROM: Full    Dental no notable dental hx.    Pulmonary neg pulmonary ROS,    Pulmonary exam normal breath sounds clear to auscultation       Cardiovascular negative cardio ROS Normal cardiovascular exam Rhythm:Regular Rate:Normal     Neuro/Psych negative neurological ROS  negative psych ROS   GI/Hepatic negative GI ROS, Neg liver ROS,   Endo/Other  negative endocrine ROS  Renal/GU negative Renal ROS     Musculoskeletal  (+) Arthritis ,   Abdominal   Peds  Hematology negative hematology ROS (+)   Anesthesia Other Findings   Reproductive/Obstetrics negative OB ROS                             Anesthesia Physical Anesthesia Plan  ASA: II  Anesthesia Plan: MAC   Post-op Pain Management:    Induction: Intravenous  Airway Management Planned:   Additional Equipment:   Intra-op Plan:   Post-operative Plan:   Informed Consent: I have reviewed the patients History and Physical, chart, labs and discussed the procedure including the risks, benefits and alternatives for the proposed anesthesia with the patient or authorized representative who has indicated his/her understanding and acceptance.   Dental advisory given  Plan Discussed with: CRNA  Anesthesia Plan Comments:         Anesthesia Quick Evaluation

## 2016-07-06 NOTE — Addendum Note (Signed)
Addendum  created 07/06/16 1122 by Lewie LoronJohn Jafar Poffenberger, MD   Sign clinical note

## 2016-07-07 ENCOUNTER — Encounter (HOSPITAL_COMMUNITY): Payer: Self-pay | Admitting: Gastroenterology

## 2018-10-22 ENCOUNTER — Ambulatory Visit (HOSPITAL_COMMUNITY)
Admission: EM | Admit: 2018-10-22 | Discharge: 2018-10-22 | Disposition: A | Discharge status: Home / Self Care | Payer: Managed Care, Other (non HMO) | Attending: Family Medicine | Admitting: Family Medicine

## 2018-10-22 ENCOUNTER — Encounter (HOSPITAL_COMMUNITY): Payer: Self-pay | Admitting: Emergency Medicine

## 2018-10-22 ENCOUNTER — Ambulatory Visit (INDEPENDENT_AMBULATORY_CARE_PROVIDER_SITE_OTHER): Payer: Managed Care, Other (non HMO)

## 2018-10-22 DIAGNOSIS — S39012A Strain of muscle, fascia and tendon of lower back, initial encounter: Secondary | ICD-10-CM

## 2018-10-22 DIAGNOSIS — S161XXA Strain of muscle, fascia and tendon at neck level, initial encounter: Secondary | ICD-10-CM

## 2018-10-22 MED ORDER — IBUPROFEN 800 MG PO TABS
ORAL_TABLET | ORAL | Status: AC
  Filled 2018-10-22: qty 1

## 2018-10-22 MED ORDER — CYCLOBENZAPRINE HCL 10 MG PO TABS: ORAL_TABLET | ORAL | 0 refills | Status: AC

## 2018-10-22 MED ORDER — DICLOFENAC SODIUM 75 MG PO TBEC: 75.0000 mg | DELAYED_RELEASE_TABLET | Freq: Two times a day (BID) | ORAL | 0 refills | Status: AC

## 2018-10-22 MED ORDER — IBUPROFEN 800 MG PO TABS
800.0000 mg | ORAL_TABLET | Freq: Once | ORAL | Status: AC
  Administered 2018-10-22: 18:00:00 800 mg via ORAL
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
## 2018-10-27 ENCOUNTER — Telehealth (HOSPITAL_COMMUNITY): Payer: Self-pay | Admitting: Emergency Medicine

## 2018-10-27 NOTE — Telephone Encounter (Signed)
Patient called and given xray results.

## 2019-08-15 ENCOUNTER — Other Ambulatory Visit: Payer: Self-pay | Admitting: Gastroenterology

## 2019-08-15 DIAGNOSIS — R1033 Periumbilical pain: Secondary | ICD-10-CM
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
## 2021-05-31 ENCOUNTER — Ambulatory Visit (HOSPITAL_COMMUNITY)
Admission: EM | Admit: 2021-05-31 | Discharge: 2021-05-31 | Disposition: A | Discharge status: Home / Self Care | Payer: Managed Care, Other (non HMO) | Attending: Student | Admitting: Student

## 2021-05-31 ENCOUNTER — Encounter (HOSPITAL_COMMUNITY): Payer: Self-pay

## 2021-05-31 ENCOUNTER — Other Ambulatory Visit: Payer: Self-pay

## 2021-05-31 DIAGNOSIS — J0101 Acute recurrent maxillary sinusitis: Secondary | ICD-10-CM

## 2021-05-31 DIAGNOSIS — J069 Acute upper respiratory infection, unspecified: Secondary | ICD-10-CM

## 2021-05-31 MED ORDER — TRIAMCINOLONE ACETONIDE 55 MCG/ACT NA AERO
2.0000 | INHALATION_SPRAY | Freq: Every day | NASAL | 1 refills | Status: AC
Start: 2021-05-31 — End: ?

## 2021-05-31 MED ORDER — AMOXICILLIN-POT CLAVULANATE 875-125 MG PO TABS: 1.0000 | ORAL_TABLET | Freq: Two times a day (BID) | ORAL | 0 refills | Status: AC

## 2021-05-31 MED ORDER — PREDNISONE 20 MG PO TABS: 40.0000 mg | ORAL_TABLET | Freq: Every day | ORAL | 0 refills | Status: AC

## 2021-05-31 NOTE — ED Triage Notes (Signed)
Pt presents with c/o cough and nasal drainage x 2 weeks.   Pt states he just finished a round of doxycycline.

## 2021-05-31 NOTE — Discharge Instructions (Addendum)
-  Start the antibiotic-Augmentin (amoxicillin-clavulanate), 1 pill every 12 hours for 7 days.  You can take this with food like with breakfast and dinner. -Prednisone, 2 pills taken at the same time for 5 days in a row.  Try taking this earlier in the day as it can give you energy. Avoid NSAIDs like ibuprofen and alleve while taking this medication as they can increase your risk of stomach upset and even GI bleeding when in combination with a steroid. You can continue tylenol (acetaminophen) up to 1000mg  3x daily. -Nasacort 1-2x daily for at least 7 days, longer if it's helping -Follow-up if symptoms worsen instead if improve

## 2021-05-31 NOTE — ED Provider Notes (Signed)
[place]    CSN: [id number] Arrival date & time: 05/31/21  1721      History   Chief Complaint Chief Complaint  Patient presents with   Cough   Muscle Pain    HPI Norvan Stettler is a 50 y.o. male presenting with cough and sinus pressure x2 weeks. Medical history noncontributory, denies history cardiopulm ds. Already had a visit with PCP and they prescribed doxycycline for sinusitis which he completed 3 days ago without improvement. Describes nonproductive cough, denies SOB, CP, dizziness, fevers/chills. Sinus pressure behind the eyes R>L. Purulent nasal congestion and PND. Photophobia. Symptoms seemed to get worse again 5 days ago. States multiple coworkers are sick but unsure what they have.    HPI  Past Medical History:  Diagnosis Date   Arthritis    Neck   Seasonal allergies     There are no problems to display for this patient.   Past Surgical History:  Procedure Laterality Date   COLONOSCOPY WITH PROPOFOL N/A 07/06/2016   Procedure: COLONOSCOPY WITH PROPOFOL;  Surgeon: Garlan Fair, MD;  Location: [facility];  Service: Endoscopy;  Laterality: N/A;   KNEE ARTHROSCOPY Left 1989   SP ARTHRO ELBOW*R* Right 2006       Home Medications    Prior to Admission medications   Medication Sig Start Date End Date Taking? Authorizing Provider  amoxicillin-clavulanate (AUGMENTIN) 875-125 MG tablet Take 1 tablet by mouth every 12 (twelve) hours. 05/31/21  Yes Hazel Sams, PA-C  predniSONE (DELTASONE) 20 MG tablet Take 2 tablets (40 mg total) by mouth daily for 5 days. Take with breakfast or lunch. Avoid NSAIDs (ibuprofen, etc) while taking this medication. 05/31/21 06/05/21 Yes Hazel Sams, PA-C  triamcinolone (NASACORT) 55 MCG/ACT AERO nasal inhaler Place 2 sprays into the nose daily. 05/31/21  Yes Hazel Sams, PA-C  cyclobenzaprine (FLEXERIL) 10 MG tablet Take 1 tablet by mouth 3 times daily as needed for muscle spasm. Warning: May cause  drowsiness. 10/22/18   Vanessa Kick, MD  diclofenac (VOLTAREN) 75 MG EC tablet Take 1 tablet (75 mg total) by mouth 2 (two) times daily. 10/22/18   Vanessa Kick, MD    Family History Family History  Problem Relation Age of Onset   Diabetes Mother    Cancer Father        Prostate    Social History Social History   Tobacco Use   Smoking status: Never   Smokeless tobacco: Never  Substance Use Topics   Alcohol use: Yes    Comment: occasional   Drug use: No     Allergies   Patient has no known allergies.   Review of Systems Review of Systems  Constitutional:  Negative for appetite change, chills and fever.  HENT:  Positive for congestion and sinus pressure. Negative for ear pain, rhinorrhea, sinus pain and sore throat.   Eyes:  Negative for redness and visual disturbance.  Respiratory:  Positive for cough. Negative for chest tightness, shortness of breath and wheezing.   Cardiovascular:  Negative for chest pain and palpitations.  Gastrointestinal:  Negative for abdominal pain, constipation, diarrhea, nausea and vomiting.  Genitourinary:  Negative for dysuria, frequency and urgency.  Musculoskeletal:  Negative for myalgias.  Neurological:  Negative for dizziness, weakness and headaches.  Psychiatric/Behavioral:  Negative for confusion.   All other systems reviewed and are negative.   Physical Exam Triage Vital Signs ED Triage Vitals  Enc Vitals Group     BP 05/31/21 1756 116/85  Pulse Rate 05/31/21 1756 93     Resp 05/31/21 1756 18     Temp 05/31/21 1756 98.3 F (36.8 C)     Temp Source 05/31/21 1756 Temporal     SpO2 05/31/21 1756 98 %     Weight --      Height --      Head Circumference --      Peak Flow --      Pain Score 05/31/21 1755 4     Pain Loc --      Pain Edu? --      Excl. in [place]? --    No data found.  Updated Vital Signs BP 116/85   Pulse 93   Temp 98.3 F (36.8 C) (Temporal)   Resp 18   SpO2 98%   Visual Acuity Right Eye Distance:    Left Eye Distance:   Bilateral Distance:    Right Eye Near:   Left Eye Near:    Bilateral Near:     Physical Exam Vitals reviewed.  Constitutional:      General: He is not in acute distress.    Appearance: Normal appearance. He is ill-appearing.  HENT:     Head: Normocephalic and atraumatic.     Right Ear: Tympanic membrane, ear canal and external ear normal. No tenderness. No middle ear effusion. There is no impacted cerumen. Tympanic membrane is not perforated, erythematous, retracted or bulging.     Left Ear: Tympanic membrane, ear canal and external ear normal. No tenderness.  No middle ear effusion. There is no impacted cerumen. Tympanic membrane is not perforated, erythematous, retracted or bulging.     Nose: No congestion.     Right Sinus: Maxillary sinus tenderness present.     Left Sinus: Maxillary sinus tenderness present.     Mouth/Throat:     Mouth: Mucous membranes are moist.     Pharynx: Uvula midline. No oropharyngeal exudate or posterior oropharyngeal erythema.  Eyes:     Extraocular Movements: Extraocular movements intact.     Pupils: Pupils are equal, round, and reactive to light.  Cardiovascular:     Rate and Rhythm: Normal rate and regular rhythm.     Heart sounds: Normal heart sounds.  Pulmonary:     Effort: Pulmonary effort is normal.     Breath sounds: Normal breath sounds. No decreased breath sounds, wheezing, rhonchi or rales.     Comments: Frequent cough Abdominal:     Palpations: Abdomen is soft.     Tenderness: There is no abdominal tenderness. There is no guarding or rebound.  Lymphadenopathy:     Cervical: No cervical adenopathy.     Right cervical: No superficial cervical adenopathy.    Left cervical: No superficial cervical adenopathy.  Neurological:     General: No focal deficit present.     Mental Status: He is alert and oriented to person, place, and time.  Psychiatric:        Mood and Affect: Mood normal.        Behavior: Behavior  normal.        Thought Content: Thought content normal.        Judgment: Judgment normal.     UC Treatments / Results  Labs (all labs ordered are listed, but only abnormal results are displayed) Labs Reviewed  RESPIRATORY PANEL BY PCR  SARS CORONAVIRUS 2 (TAT 6-24 HRS)    EKG   Radiology No results found.  Procedures Procedures (including critical care time)  Medications Ordered  in UC Medications - No data to display  Initial Impression / Assessment and Plan / UC Course  I have reviewed the triage vital signs and the nursing notes.  Pertinent labs & imaging results that were available during my care of the patient were reviewed by me and considered in my medical decision making (see chart for details).     This patient is a very pleasant 50 y.o. year old male presenting with sinusitis and viral URI. Today this pt is afebrile nontachycardic nontachypneic, oxygenating well on room air, no wheezes rhonchi or rales.   Recently treated with doxycycline for sinusitis without improvement. Ddx is secondary sickening syndrome vs new URI; will send Covid and influenza testing today. He is out of antiviral window.   Augmentin as below. Also low-dose prednisone and nasacort.   ED return precautions discussed. Patient verbalizes understanding and agreement.  .   Final Clinical Impressions(s) / UC Diagnoses   Final diagnoses:  Acute recurrent maxillary sinusitis  Viral URI with cough     Discharge Instructions      -Start the antibiotic-Augmentin (amoxicillin-clavulanate), 1 pill every 12 hours for 7 days.  You can take this with food like with breakfast and dinner. -Prednisone, 2 pills taken at the same time for 5 days in a row.  Try taking this earlier in the day as it can give you energy. Avoid NSAIDs like ibuprofen and alleve while taking this medication as they can increase your risk of stomach upset and even GI bleeding when in combination with a steroid. You can  continue tylenol (acetaminophen) up to 1000mg  3x daily. -Nasacort 1-2x daily for at least 7 days, longer if it's helping -Follow-up if symptoms worsen instead if improve     ED Prescriptions     Medication Sig Dispense Auth. Provider   predniSONE (DELTASONE) 20 MG tablet Take 2 tablets (40 mg total) by mouth daily for 5 days. Take with breakfast or lunch. Avoid NSAIDs (ibuprofen, etc) while taking this medication. 10 tablet , PA-C   triamcinolone (NASACORT) 55 MCG/ACT AERO nasal inhaler Place 2 sprays into the nose daily. 1 each Rhys Martini, PA-C   amoxicillin-clavulanate (AUGMENTIN) 875-125 MG tablet Take 1 tablet by mouth every 12 (twelve) hours. 14 tablet Rhys Martini, PA-C      PDMP not reviewed this encounter.   Rhys Martini, PA-C 05/31/21 1827

## 2021-06-01 ENCOUNTER — Telehealth (HOSPITAL_COMMUNITY): Payer: Self-pay

## 2021-06-01 NOTE — Telephone Encounter (Signed)
Called,to let pt know Respiratory panel swab, needs to be redone. Pt not answered.

## 2022-05-18 DIAGNOSIS — L578 Other skin changes due to chronic exposure to nonionizing radiation: Secondary | ICD-10-CM | POA: Diagnosis not present

## 2022-05-18 DIAGNOSIS — D485 Neoplasm of uncertain behavior of skin: Secondary | ICD-10-CM | POA: Diagnosis not present

## 2022-05-18 DIAGNOSIS — L821 Other seborrheic keratosis: Secondary | ICD-10-CM | POA: Diagnosis not present

## 2022-05-18 DIAGNOSIS — L814 Other melanin hyperpigmentation: Secondary | ICD-10-CM | POA: Diagnosis not present

## 2022-05-18 DIAGNOSIS — D225 Melanocytic nevi of trunk: Secondary | ICD-10-CM | POA: Diagnosis not present

## 2022-05-18 DIAGNOSIS — L570 Actinic keratosis: Secondary | ICD-10-CM | POA: Diagnosis not present

## 2022-09-23 DIAGNOSIS — G4719 Other hypersomnia: Secondary | ICD-10-CM | POA: Diagnosis not present

## 2022-09-23 DIAGNOSIS — I10 Essential (primary) hypertension: Secondary | ICD-10-CM | POA: Diagnosis not present

## 2022-09-23 DIAGNOSIS — F419 Anxiety disorder, unspecified: Secondary | ICD-10-CM | POA: Diagnosis not present

## 2022-10-20 DIAGNOSIS — G4733 Obstructive sleep apnea (adult) (pediatric): Secondary | ICD-10-CM | POA: Diagnosis not present

## 2022-10-20 DIAGNOSIS — F419 Anxiety disorder, unspecified: Secondary | ICD-10-CM | POA: Diagnosis not present

## 2022-10-20 DIAGNOSIS — I10 Essential (primary) hypertension: Secondary | ICD-10-CM | POA: Diagnosis not present

## 2022-11-11 DIAGNOSIS — C44311 Basal cell carcinoma of skin of nose: Secondary | ICD-10-CM | POA: Diagnosis not present

## 2022-11-11 DIAGNOSIS — D485 Neoplasm of uncertain behavior of skin: Secondary | ICD-10-CM | POA: Diagnosis not present

## 2022-11-11 DIAGNOSIS — L578 Other skin changes due to chronic exposure to nonionizing radiation: Secondary | ICD-10-CM | POA: Diagnosis not present

## 2022-11-12 DIAGNOSIS — G4733 Obstructive sleep apnea (adult) (pediatric): Secondary | ICD-10-CM | POA: Diagnosis not present

## 2022-12-09 DIAGNOSIS — C44311 Basal cell carcinoma of skin of nose: Secondary | ICD-10-CM | POA: Diagnosis not present

## 2022-12-13 DIAGNOSIS — G4733 Obstructive sleep apnea (adult) (pediatric): Secondary | ICD-10-CM | POA: Diagnosis not present

## 2023-01-12 DIAGNOSIS — G4733 Obstructive sleep apnea (adult) (pediatric): Secondary | ICD-10-CM | POA: Diagnosis not present

## 2023-01-19 DIAGNOSIS — L905 Scar conditions and fibrosis of skin: Secondary | ICD-10-CM | POA: Diagnosis not present

## 2023-02-08 DIAGNOSIS — R21 Rash and other nonspecific skin eruption: Secondary | ICD-10-CM | POA: Diagnosis not present

## 2023-02-08 DIAGNOSIS — Z125 Encounter for screening for malignant neoplasm of prostate: Secondary | ICD-10-CM | POA: Diagnosis not present

## 2023-02-08 DIAGNOSIS — F419 Anxiety disorder, unspecified: Secondary | ICD-10-CM | POA: Diagnosis not present

## 2023-02-08 DIAGNOSIS — I10 Essential (primary) hypertension: Secondary | ICD-10-CM | POA: Diagnosis not present

## 2023-02-08 DIAGNOSIS — Z0000 Encounter for general adult medical examination without abnormal findings: Secondary | ICD-10-CM | POA: Diagnosis not present

## 2023-02-08 DIAGNOSIS — G4733 Obstructive sleep apnea (adult) (pediatric): Secondary | ICD-10-CM | POA: Diagnosis not present

## 2023-02-08 DIAGNOSIS — E6609 Other obesity due to excess calories: Secondary | ICD-10-CM | POA: Diagnosis not present

## 2023-02-08 DIAGNOSIS — Z79899 Other long term (current) drug therapy: Secondary | ICD-10-CM | POA: Diagnosis not present

## 2023-02-08 DIAGNOSIS — F329 Major depressive disorder, single episode, unspecified: Secondary | ICD-10-CM | POA: Diagnosis not present

## 2023-02-08 DIAGNOSIS — I6521 Occlusion and stenosis of right carotid artery: Secondary | ICD-10-CM | POA: Diagnosis not present

## 2023-02-08 DIAGNOSIS — R0981 Nasal congestion: Secondary | ICD-10-CM | POA: Diagnosis not present

## 2023-02-08 DIAGNOSIS — Z6831 Body mass index (BMI) 31.0-31.9, adult: Secondary | ICD-10-CM | POA: Diagnosis not present

## 2023-02-08 DIAGNOSIS — U071 COVID-19: Secondary | ICD-10-CM | POA: Diagnosis not present

## 2023-02-08 DIAGNOSIS — R2232 Localized swelling, mass and lump, left upper limb: Secondary | ICD-10-CM | POA: Diagnosis not present

## 2023-02-17 DIAGNOSIS — I10 Essential (primary) hypertension: Secondary | ICD-10-CM | POA: Diagnosis not present

## 2023-03-04 DIAGNOSIS — R319 Hematuria, unspecified: Secondary | ICD-10-CM | POA: Diagnosis not present

## 2023-06-21 DIAGNOSIS — Z23 Encounter for immunization: Secondary | ICD-10-CM | POA: Diagnosis not present

## 2023-06-21 DIAGNOSIS — G4733 Obstructive sleep apnea (adult) (pediatric): Secondary | ICD-10-CM | POA: Diagnosis not present

## 2024-02-09 ENCOUNTER — Other Ambulatory Visit (HOSPITAL_BASED_OUTPATIENT_CLINIC_OR_DEPARTMENT_OTHER): Payer: Self-pay | Admitting: Internal Medicine

## 2024-02-09 DIAGNOSIS — E781 Pure hyperglyceridemia: Secondary | ICD-10-CM

## 2024-02-10 ENCOUNTER — Ambulatory Visit
Admission: RE | Admit: 2024-02-10 | Discharge: 2024-02-10 | Disposition: A | Discharge status: Home / Self Care | Payer: Self-pay | Source: Ambulatory Visit | Attending: Internal Medicine | Admitting: Internal Medicine

## 2024-02-10 DIAGNOSIS — E781 Pure hyperglyceridemia: Secondary | ICD-10-CM | POA: Insufficient documentation

## 2024-05-24 ENCOUNTER — Telehealth (HOSPITAL_BASED_OUTPATIENT_CLINIC_OR_DEPARTMENT_OTHER): Payer: Self-pay | Admitting: *Deleted

## 2024-05-24 ENCOUNTER — Ambulatory Visit: Attending: Cardiology | Admitting: Cardiology

## 2024-05-24 ENCOUNTER — Ambulatory Visit: Admitting: Cardiology

## 2024-05-24 ENCOUNTER — Encounter: Payer: Self-pay | Admitting: Cardiology

## 2024-05-24 ENCOUNTER — Other Ambulatory Visit (HOSPITAL_COMMUNITY): Payer: Self-pay

## 2024-05-24 VITALS — BP 102/62 | HR 64 | Ht 69.0 in | Wt 222.0 lb

## 2024-05-24 DIAGNOSIS — R931 Abnormal findings on diagnostic imaging of heart and coronary circulation: Secondary | ICD-10-CM | POA: Diagnosis not present

## 2024-05-24 DIAGNOSIS — R0609 Other forms of dyspnea: Secondary | ICD-10-CM | POA: Diagnosis not present

## 2024-05-24 DIAGNOSIS — I2510 Atherosclerotic heart disease of native coronary artery without angina pectoris: Secondary | ICD-10-CM | POA: Diagnosis not present

## 2024-05-24 MED ORDER — ROSUVASTATIN CALCIUM 10 MG PO TABS
10.0000 mg | ORAL_TABLET | Freq: Every day | ORAL | 3 refills | Status: DC
  Filled 2024-05-24: qty 90, 90d supply, fill #0

## 2024-05-24 MED ORDER — PANTOPRAZOLE SODIUM 20 MG PO TBEC
20.0000 mg | DELAYED_RELEASE_TABLET | Freq: Every day | ORAL | 2 refills | Status: AC
  Filled 2024-05-24: qty 30, 30d supply, fill #0

## 2024-05-24 NOTE — Telephone Encounter (Signed)
 I saw the patient in the office today and ordered some test.  I will complete the clearance after the test.  Thanks MJP

## 2024-05-24 NOTE — Telephone Encounter (Signed)
 I will update the preop APP as well to see Dr. Gilda note.

## 2024-05-24 NOTE — Progress Notes (Signed)
 Cardiology Office Note:  .   Date:  05/24/2024  ID:  Jermaine Norleen Beryle Mickey., DOB December 23, 1970, MRN [id number] PCP: Charlott Dorn LABOR, MD  [facility] Providers Cardiologist:  Newman Lawrence, MD PCP: Charlott Dorn LABOR, MD  Chief Complaint  Patient presents with   Coronary calcification   Retrosternal burning     [name]. is a 53 y.o. male with hypertension, hyperlipidemia, coronary calcification  Discussed the use of AI scribe software for clinical note transcription with the patient, who gave verbal consent to proceed.  History of Present Illness Jermaine Stacks is a 53 year old male with elevated triglycerides and coronary artery calcium  who presents with joint pain and heartburn. He is accompanied by his girlfriend. He was referred by Dr. Charlott for evaluation of elevated triglycerides and coronary artery calcium .  His triglycerides were first noted to be elevated last year and are higher this year. A coronary artery calcium  scan showed a score of 197. In August, he started rosuvastatin  but developed persistent elbow and wrist joint pain, which did not resolve with discontinuation, so he is currently off rosuvastatin . He continues aspirin 81 mg, losartan, and amlodipine for blood pressure.  He has severe nocturnal heartburn that began before his last physical and often wakes him from sleep. He tested positive for H. pylori and completed a 14-day antibiotic regimen, but repeat testing remained positive. He is not on acid-suppressive therapy.  He reports shortness of breath with exertion and extreme fatigue, saying he feels he could sleep for days. He has obstructive sleep apnea but has difficulty using his CPAP. Nighttime awakenings from heartburn and ongoing exertional dyspnea and fatigue persist despite time in bed.      Vitals:   05/24/24 1113  BP: 102/62  Pulse: 64  SpO2: 95%      Review of Systems  Constitutional: Positive  for malaise/fatigue.  Cardiovascular:  Negative for chest pain, dyspnea on exertion, leg swelling, palpitations and syncope.       Retrosternal burning        Studies Reviewed: SABRA        EKG 05/24/2024: Sinus rhythm with 1st degree A-V block When compared with ECG of 13-Jun-2009 11:04, PR interval has increased   CT cardiac scoring 01/2024: LM: 0 LAD: 54.3 Lcx: 0 RCA: 143 Total score: 197 (89th percentile)  Labs 01/2024: Chol 136, TG 169, HDL 38, LDL 110 HbA1C 5.9% Hb 16.2 Cr 1.09, eGFR 80   Physical Exam Vitals and nursing note reviewed.  Constitutional:      General: He is not in acute distress. Neck:     Vascular: No JVD.  Cardiovascular:     Rate and Rhythm: Normal rate and regular rhythm.     Heart sounds: Normal heart sounds. No murmur heard. Pulmonary:     Effort: Pulmonary effort is normal.     Breath sounds: Normal breath sounds. No wheezing or rales.  Musculoskeletal:     Right lower leg: No edema.     Left lower leg: No edema.      VISIT DIAGNOSES:   ICD-10-CM   1. [place] coronary artery calcium  score between 100 and 400  R93.1 EKG 12-Lead    2. Coronary artery calcification  I25.10 MYOCARDIAL PERFUSION IMAGING    ECHOCARDIOGRAM COMPLETE    3. DOE (dyspnea on exertion)  R06.09 Pro b natriuretic peptide (BNP)    MYOCARDIAL PERFUSION IMAGING    ECHOCARDIOGRAM COMPLETE  Jermaine Rush Pride Gonzales. is a 53 y.o. male with hypertension, hyperlipidemia, coronary calcification  Assessment & Plan Retrosternal burning: Nocturnal episodes, recent H. pylori infection.  Most likely etiology is acid reflux.  However, he does have coronary calcification noted on CT cardiac scoring scan.  Recommend exercise nuclear stress test and echocardiogram for further evaluation. He is currently off statins, but I do not think his elbow pain that continued after holding statin is related to statin myalgias. Prescribed Crestor  10 mg daily.  Goal LDL <70. Follow-up  lipid panel with PCP. After further workup is completed, continue aspirin 81 mg daily with food. In addition, also prescribed him pantoprazole  20 mg daily pending further GI workup. Given that his EGD has been on hold pending cardiac evaluation, I would hold off ultimate clearance until after exercise nuclear stress test.  Dyspnea on exertion: No evidence of heart failure on exam.  Check proBNP, echocardiogram, exercise nuclear stress test.  Primary hypertension:  Blood pressure well controlled. - Continue losartan and amlodipine.  Fatigue: If above cardiac workup is unremarkable, consider sleep apnea as a possible etiology.        Informed Consent   Shared Decision Making/Informed Consent The risks [chest pain, shortness of breath, cardiac arrhythmias, dizziness, blood pressure fluctuations, myocardial infarction, stroke/transient ischemic attack, nausea, vomiting, allergic reaction, radiation exposure, metallic taste sensation and life-threatening complications (estimated to be 1 in 10,000)], benefits (risk stratification, diagnosing coronary artery disease, treatment guidance) and alternatives of a nuclear stress test were discussed in detail with Mr. Shad and he agrees to proceed.       Meds ordered this encounter  Medications   rosuvastatin  (CRESTOR ) 10 MG tablet    Sig: Take 1 tablet (10 mg total) by mouth daily.    Dispense:  90 tablet    Refill:  3   pantoprazole  (PROTONIX ) 20 MG tablet    Sig: Take 1 tablet (20 mg total) by mouth daily.    Dispense:  30 tablet    Refill:  2     F/u in 4 weeks  Signed, Newman JINNY Lawrence, MD

## 2024-05-24 NOTE — Telephone Encounter (Signed)
   Name: Ismael [facility].  DOB: 10-Mar-1971  MRN: [id number]  Primary Cardiologist: None  Chart reviewed as part of pre-operative protocol coverage. The patient has an upcoming visit scheduled with Dr. Elmira on 06/14/24 at which time clearance can be addressed in case there are any issues that would impact surgical recommendations.  EGD is not scheduled until as below. I added preop FYI to appointment note so that provider is aware to address at time of outpatient visit.  Per office protocol the cardiology provider should forward their finalized clearance decision and recommendations regarding antiplatelet therapy to the requesting party below.    I will route this message as FYI to requesting party and remove this message from the preop box as separate preop APP input not needed at this time.   Please call with any questions.  Jamai Dolce D Mercedes Valeriano, NP  05/24/2024, 11:10 AM

## 2024-05-24 NOTE — Telephone Encounter (Signed)
   Pre-operative Risk Assessment    Patient Name: [name].  DOB: October 21, 1970 MRN: [id number]   Date of last office visit: 07/23/2005 Date of next office visit: 06/14/24 DR. PATWARDHAN; NEW PT APPT    Request for Surgical Clearance    Procedure:  EGD  Date of Surgery:  Clearance TBD                                Surgeon:  DR. MANN Surgeon's Group or Practice Name:  [facility] Phone number:  [phone] Fax number:  [phone]   Type of Clearance Requested:   - Medical    Type of Anesthesia:  PROPOFOL    Additional requests/questions:    Bonney Niels Jest   05/24/2024, 10:53 AM

## 2024-05-24 NOTE — Patient Instructions (Signed)
 Medication Instructions:  START Crestor  10 mg  START Pantoprazole  20 mg   *If you need a refill on your cardiac medications before your next appointment, please call your pharmacy*  Lab Work: PRO BNP  If you have labs (blood work) drawn today and your tests are completely normal, you will receive your results only by: MyChart Message (if you have MyChart) OR A paper copy in the mail If you have any lab test that is abnormal or we need to change your treatment, we will call you to review the results.  Testing/Procedures: Echocardiogram  Your physician has requested that you have an echocardiogram. Echocardiography is a painless test that uses sound waves to create images of your heart. It provides your doctor with information about the size and shape of your heart and how well your heart's chambers and valves are working. This procedure takes approximately one hour. There are no restrictions for this procedure. Please do NOT wear cologne, perfume, aftershave, or lotions (deodorant is allowed). Please arrive 15 minutes prior to your appointment time.  Please note: We ask at that you not bring children with you during ultrasound (echo/ vascular) testing. Due to room size and safety concerns, children are not allowed in the ultrasound rooms during exams. Our front office staff cannot provide observation of children in our lobby area while testing is being conducted. An adult accompanying a patient to their appointment will only be allowed in the ultrasound room at the discretion of the ultrasound technician under special circumstances. We apologize for any inconvenience.  Exercise nuclear stress test   Your physician has requested that you have en exercise stress myoview. For further information please visit [email or web address] Please follow instruction sheet, as given.   Follow-Up: At [facility], you and your health needs are our priority.  As part of our continuing mission to  provide you with exceptional heart care, our providers are all part of one team.  This team includes your primary Cardiologist (physician) and Advanced Practice Providers or APPs (Physician Assistants and Nurse Practitioners) who all work together to provide you with the care you need, when you need it.  Your next appointment:   Keep 12/17 appt   Provider:   Newman JINNY Lawrence, MD

## 2024-05-25 ENCOUNTER — Ambulatory Visit: Payer: Self-pay | Admitting: Cardiology

## 2024-05-25 LAB — PRO B NATRIURETIC PEPTIDE: NT-Pro BNP: 75 pg/mL (ref 0–210)

## 2024-05-31 ENCOUNTER — Other Ambulatory Visit: Payer: Self-pay

## 2024-05-31 DIAGNOSIS — R0609 Other forms of dyspnea: Secondary | ICD-10-CM

## 2024-05-31 DIAGNOSIS — I2510 Atherosclerotic heart disease of native coronary artery without angina pectoris: Secondary | ICD-10-CM

## 2024-05-31 NOTE — Progress Notes (Unsigned)
 Needs attestation for stress test

## 2024-06-01 ENCOUNTER — Telehealth (HOSPITAL_COMMUNITY): Payer: Self-pay | Admitting: *Deleted

## 2024-06-01 ENCOUNTER — Other Ambulatory Visit: Payer: Self-pay | Admitting: Cardiology

## 2024-06-01 DIAGNOSIS — I2510 Atherosclerotic heart disease of native coronary artery without angina pectoris: Secondary | ICD-10-CM

## 2024-06-01 DIAGNOSIS — R0609 Other forms of dyspnea: Secondary | ICD-10-CM

## 2024-06-01 NOTE — Telephone Encounter (Signed)
 Left a detail message with instructions as a reminder regarding a Stress Test on 06/02/24 at 10:15.

## 2024-06-02 ENCOUNTER — Inpatient Hospital Stay (HOSPITAL_COMMUNITY): Admission: RE | Admit: 2024-06-02 | Discharge: 2024-06-02 | Attending: Cardiology

## 2024-06-02 ENCOUNTER — Ambulatory Visit (HOSPITAL_COMMUNITY)

## 2024-06-02 ENCOUNTER — Ambulatory Visit (HOSPITAL_COMMUNITY): Admission: RE | Admit: 2024-06-02 | Discharge: 2024-06-02 | Attending: Cardiology

## 2024-06-02 DIAGNOSIS — R0609 Other forms of dyspnea: Secondary | ICD-10-CM

## 2024-06-02 DIAGNOSIS — I2510 Atherosclerotic heart disease of native coronary artery without angina pectoris: Secondary | ICD-10-CM

## 2024-06-02 LAB — MYOCARDIAL PERFUSION IMAGING
Angina Index: 0
Base ST Depression (mm): 0 mm
Duke Treadmill Score: 6
Estimated workload: 7
Exercise duration (min): 6 min
Exercise duration (sec): 0 s
LV dias vol: 77 mL (ref 62–150)
LV sys vol: 21 mL (ref 4.2–5.8)
MPHR: 165 {beats}/min
Nuc Stress EF: 73 %
Peak HR: 151 {beats}/min
Percent HR: 95 %
Rest HR: 88 {beats}/min
Rest Nuclear Isotope Dose: 10.3 mCi
SDS: 1
SRS: 1
SSS: 2
ST Depression (mm): 0 mm
Stress Nuclear Isotope Dose: 30.9 mCi
TID: 0.91

## 2024-06-02 LAB — ECHOCARDIOGRAM COMPLETE
Area-P 1/2: 3.54 cm2
S' Lateral: 3.2 cm

## 2024-06-02 MED ORDER — TECHNETIUM TC 99M TETROFOSMIN IV KIT
30.9000 | PACK | Freq: Once | INTRAVENOUS | Status: AC | PRN
  Administered 2024-06-02: 30.9 via INTRAVENOUS

## 2024-06-02 MED ORDER — TECHNETIUM TC 99M TETROFOSMIN IV KIT
10.3000 | PACK | Freq: Once | INTRAVENOUS | Status: AC | PRN
  Administered 2024-06-02: 10.3 via INTRAVENOUS

## 2024-06-02 NOTE — Progress Notes (Signed)
 Very small area in heart muscle with possible reduced blood flow. Will discuss more during office visit on 12/17.   Regards, Dr. Elmira

## 2024-06-05 NOTE — Progress Notes (Signed)
 MyChart message containing providers result note and interpretation read by patient on: Last read by Miquel Norleen Beryle Teddie Norleen at 5:25PM on 06/02/2024.

## 2024-06-12 ENCOUNTER — Other Ambulatory Visit: Payer: Self-pay

## 2024-06-12 MED ORDER — ROSUVASTATIN CALCIUM 10 MG PO TABS: 10.0000 mg | ORAL_TABLET | Freq: Every day | ORAL | 3 refills | Status: DC

## 2024-06-14 ENCOUNTER — Ambulatory Visit: Admitting: Cardiology

## 2024-06-14 ENCOUNTER — Encounter: Payer: Self-pay | Admitting: Cardiology

## 2024-06-14 VITALS — BP 120/70 | HR 100 | Ht 69.0 in | Wt 226.3 lb

## 2024-06-14 DIAGNOSIS — Z01810 Encounter for preprocedural cardiovascular examination: Secondary | ICD-10-CM | POA: Diagnosis not present

## 2024-06-14 DIAGNOSIS — E782 Mixed hyperlipidemia: Secondary | ICD-10-CM | POA: Insufficient documentation

## 2024-06-14 DIAGNOSIS — I2510 Atherosclerotic heart disease of native coronary artery without angina pectoris: Secondary | ICD-10-CM

## 2024-06-14 MED ORDER — EZETIMIBE 10 MG PO TABS: 10.0000 mg | ORAL_TABLET | Freq: Every day | ORAL | 3 refills | Status: AC

## 2024-06-14 NOTE — Patient Instructions (Signed)
 Medication Instructions:  Discontinue your Crestor  and start taking Zetia  10mg  daily.  *If you need a refill on your cardiac medications before your next appointment, please call your pharmacy*  Lab Work: Lipid panel to be drawn in March. Any [facility] is fine.   If you have labs (blood work) drawn today and your tests are completely normal, you will receive your results only by: MyChart Message (if you have MyChart) OR A paper copy in the mail If you have any lab test that is abnormal or we need to change your treatment, we will call you to review the results.  Testing/Procedures: None  Follow-Up: At [facility], you and your health needs are our priority.  As part of our continuing mission to provide you with exceptional heart care, our providers are all part of one team.  This team includes your primary Cardiologist (physician) and Advanced Practice Providers or APPs (Physician Assistants and Nurse Practitioners) who all work together to provide you with the care you need, when you need it.  Your next appointment:   3 month(s) After blood draw  Provider:   Newman JINNY Lawrence, MD

## 2024-06-14 NOTE — Progress Notes (Signed)
 Cardiology Office Note:  .   Date:  06/14/2024  ID:  Jermaine Norleen Beryle Mickey., DOB October 15, 1970, MRN [id number] PCP: Charlott Dorn LABOR, MD  [facility] Providers Cardiologist:  Newman Lawrence, MD PCP: Charlott Dorn LABOR, MD  Chief Complaint  Patient presents with   Coronary calcification     Jermaine Norleen Cherry Wittwer. is a 53 y.o. male with hypertension, hyperlipidemia, coronary calcification  Discussed the use of AI scribe software for clinical note transcription with the patient, who gave verbal consent to proceed.  History of Present Illness Reviewed recent stress test results, echocardiogram is with the patient, due to .  Patient denies any chest pain, only has had nighttime retrosternal burning sensation.  Denies any exertional dyspnea.  In the past, he has been intolerant to atorvastatin, and now to rosuvastatin  due to severe muscle aches and pains symptoms.      Vitals:   06/14/24 1535  BP: 120/70  Pulse: 100  SpO2: 98%       Review of Systems  Constitutional: Positive for malaise/fatigue.  Cardiovascular:  Negative for chest pain, dyspnea on exertion, leg swelling, palpitations and syncope.       Retrosternal burning        Studies Reviewed: SABRA        EKG 05/24/2024: Sinus rhythm with 1st degree A-V block When compared with ECG of 13-Jun-2009 11:04, PR interval has increased  Stress test 05/2024:   Findings are consistent with ischemia. The study is low risk (small territory involved).   No ST deviation was noted.   LV perfusion is abnormal. There is evidence of ischemia. There is evidence of infarction. Defect 1: There is a medium defect with moderate reduction in uptake present in the apical anteroseptal location(s) that is fixed. There is normal wall motion in the defect area. Defect 2: There is a small defect with mild reduction in uptake present in the mid lateral location(s) that is reversible.   Left ventricular function is normal.  Nuclear stress EF: 73%. The left ventricular ejection fraction is hyperdynamic (>65%). End diastolic cavity size is normal. End systolic cavity size is normal.   CT images were obtained for attenuation correction and were examined for the presence of coronary calcium  when appropriate.   Coronary calcium  was present on the attenuation correction CT images. Mild coronary calcifications were present.   Echocardiogram 05/2024:  1. Left ventricular ejection fraction, by estimation, is 60 to 65%. The  left ventricle has normal function. The left ventricle has no regional  wall motion abnormalities. Left ventricular diastolic parameters were  normal.   2. Right ventricular systolic function is normal. The right ventricular  size is normal.   3. The mitral valve is normal in structure. No evidence of mitral valve  regurgitation. No evidence of mitral stenosis.   4. The aortic valve is normal in structure. Aortic valve regurgitation is  not visualized. No aortic stenosis is present.   5. The inferior vena cava is normal in size with greater than 50%  respiratory variability, suggesting right atrial pressure of 3 mmHg.    CT cardiac scoring 01/2024: LM: 0 LAD: 54.3 Lcx: 0 RCA: 143 Total score: 197 (89th percentile)  Labs 05/2024: ProBNP 75  01/2024: Chol 136, TG 169, HDL 38, LDL 110 HbA1C 5.9% Hb 16.2 Cr 1.09, eGFR 80   Physical Exam Vitals and nursing note reviewed.  Constitutional:      General: He is not in acute distress. Neck:  Vascular: No JVD.  Cardiovascular:     Rate and Rhythm: Normal rate and regular rhythm.     Heart sounds: Normal heart sounds. No murmur heard. Pulmonary:     Effort: Pulmonary effort is normal.     Breath sounds: Normal breath sounds. No wheezing or rales.  Musculoskeletal:     Right lower leg: No edema.     Left lower leg: No edema.      VISIT DIAGNOSES:   ICD-10-CM   1. Coronary artery calcification  I25.10 Lipid panel    ezetimibe   (ZETIA ) 10 MG tablet    2. Mixed hyperlipidemia  E78.2 Lipid panel    ezetimibe  (ZETIA ) 10 MG tablet    3. Preop cardiovascular exam  Z01.810         [name]. is a 53 y.o. male with hypertension, hyperlipidemia, coronary calcification  Assessment & Plan Retrosternal burning: Nocturnal episodes, recent H. pylori infection.  Mild stress test abnormality, unlikely to explain patient's symptoms.  I do suspect symptoms are related to gastrointestinal etiology. Okay to proceed with upcoming endoscopy with low cardiac risk.  Okay to hold aspirin up to 5 days before the procedure.  Coronary calcification: Mild ischemia.  Recommend aspirin 81 mg daily.  He is intolerant to rosuvastatin  and atorvastatin due to side effects.  Will try Zetia  10 mg daily and repeat lipid panel in 3 months. If LDL remains elevated, will then need to look into injectable agents.     Meds ordered this encounter  Medications   ezetimibe  (ZETIA ) 10 MG tablet    Sig: Take 1 tablet (10 mg total) by mouth daily.    Dispense:  90 tablet    Refill:  3     F/u in 4 weeks  Signed, Newman JINNY Lawrence, MD

## 2024-07-04 ENCOUNTER — Ambulatory Visit (HOSPITAL_COMMUNITY)

## 2024-07-06 NOTE — Telephone Encounter (Signed)
 Requesting office inquiring if pt has been cleared, see notes.

## 2024-07-07 NOTE — Telephone Encounter (Signed)
 Low risk, okay to proceed.  Thanks MJP

## 2024-07-10 NOTE — Telephone Encounter (Signed)
"  ° °  Patient Name: [name].  DOB: 1970/10/31 MRN: [id number]  Primary Cardiologist: Newman JINNY Alisa Stjames, MD  Chart reviewed as part of pre-operative protocol coverage. Given past medical history and time since last visit, based on ACC/AHA guidelines, [name]. is at acceptable risk for the planned procedure without further cardiovascular testing.   The patient was advised that if he develops new symptoms prior to surgery to contact our office to arrange for a follow-up visit, and he verbalized understanding.  Regarding ASA therapy, we recommend continuation of ASA throughout the perioperative period.  However, if the surgeon feels that cessation of ASA is required in the perioperative period, it may be stopped 5-7 days prior to surgery with a plan to resume it as soon as felt to be feasible from a surgical standpoint in the post-operative period.   I will route this recommendation to the requesting party via Epic fax function and remove from pre-op pool.  Please call with questions.  Lamarr Satterfield, NP 07/10/2024, 7:39 AM  "

## 2024-09-12 ENCOUNTER — Ambulatory Visit: Payer: Self-pay | Admitting: Cardiology
